# Patient Record
Sex: Male | Born: 1995 | Race: White | Hispanic: No | Marital: Single | Smoking: Current some day smoker
Health system: Southern US, Community
[De-identification: ages and names within clinical notes are randomized; demographics above are authoritative.]

---
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
## 2018-05-22 ENCOUNTER — Ambulatory Visit (HOSPITAL_COMMUNITY)
Admission: EM | Admit: 2018-05-22 | Discharge: 2018-05-22 | Disposition: A | Discharge status: Home / Self Care | Payer: Managed Care, Other (non HMO) | Attending: Family Medicine | Admitting: Family Medicine

## 2018-05-22 ENCOUNTER — Encounter (HOSPITAL_COMMUNITY): Payer: Self-pay | Admitting: Emergency Medicine

## 2018-05-22 DIAGNOSIS — R5383 Other fatigue: Secondary | ICD-10-CM

## 2018-05-22 DIAGNOSIS — R42 Dizziness and giddiness: Secondary | ICD-10-CM | POA: Diagnosis not present

## 2018-05-22 DIAGNOSIS — B349 Viral infection, unspecified: Secondary | ICD-10-CM | POA: Diagnosis not present

## 2018-05-22 DIAGNOSIS — F17200 Nicotine dependence, unspecified, uncomplicated: Secondary | ICD-10-CM | POA: Diagnosis not present

## 2018-05-22 LAB — POCT INFECTIOUS MONO SCREEN: Mono Screen: NEGATIVE

## 2018-05-22 LAB — CBC
HCT: 49.6 % (ref 39.0–52.0)
Hemoglobin: 16.7 g/dL (ref 13.0–17.0)
MCH: 28.7 pg (ref 26.0–34.0)
MCHC: 33.7 g/dL (ref 30.0–36.0)
MCV: 85.2 fL (ref 80.0–100.0)
Platelets: 197 10*3/uL (ref 150–400)
RBC: 5.82 MIL/uL — ABNORMAL HIGH (ref 4.22–5.81)
RDW: 12.4 % (ref 11.5–15.5)
WBC: 6.5 10*3/uL (ref 4.0–10.5)
nRBC: 0 % (ref 0.0–0.2)

## 2018-05-22 LAB — POCT I-STAT, CHEM 8
BUN: 9 mg/dL (ref 6–20)
CALCIUM ION: 1.22 mmol/L (ref 1.15–1.40)
CREATININE: 1 mg/dL (ref 0.61–1.24)
Chloride: 103 mmol/L (ref 98–111)
GLUCOSE: 93 mg/dL (ref 70–99)
HCT: 49 % (ref 39.0–52.0)
HEMOGLOBIN: 16.7 g/dL (ref 13.0–17.0)
Potassium: 4 mmol/L (ref 3.5–5.1)
Sodium: 142 mmol/L (ref 135–145)
TCO2: 27 mmol/L (ref 22–32)

## 2018-05-22 NOTE — ED Provider Notes (Signed)
[facility]    CSN: [id number] Arrival date & time: 05/22/18  1051     History   Chief Complaint Chief Complaint  Patient presents with  . Fatigue  . Dizziness    HPI Lawrence Hooper is a 22 y.o. male.   This is an initial visit for this 22 year old man.  Pt c/o feeling weak and lightheaded. Pt c/o fatigued. Pt states when he stood up and walked over for triage he felt like his head was fuzzy.   Initially, patient had a sore throat but this is passed.  He has not had any cough, nausea, vomiting, headache, or stiff neck.  He has been able to work all week.  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     History reviewed. No pertinent past medical history.  There are no active problems to display for this patient.   History reviewed. No pertinent surgical history.     Home Medications    Prior to Admission medications   Medication Sig Start Date End Date Taking? Authorizing Provider  guaiFENesin (MUCINEX) 600 MG 12 hr tablet Take by mouth 2 (two) times daily.   Yes [provider]    Family History Family History  Problem Relation Age of Onset  . Healthy Mother   . Healthy Father   . Cancer Maternal Grandmother     Social History Social History   Tobacco Use  . Smoking status: Current Some Day Smoker  Substance Use Topics  . Alcohol use: Yes  . Drug use: Yes    Types: Marijuana     Allergies   Patient has no known allergies.   Review of Systems Review of Systems   Physical Exam Triage Vital Signs ED Triage Vitals  Enc Vitals Group     BP 05/22/18 1148 (!) 145/95     Pulse Rate 05/22/18 1148 62     Resp 05/22/18 1148 16     Temp 05/22/18 1148 98.1 F (36.7 C)     Temp src --      SpO2 05/22/18 1148 100 %     Weight --      Height --      Head Circumference --      Peak Flow --      Pain Score 05/22/18 1149 0     Pain Loc --      Pain Edu? --      Excl. in GC? --      No data found.  Updated Vital Signs BP (!) 145/95   Pulse 62   Temp 98.1 F (36.7 C)   Resp 16   SpO2 100%    Physical Exam  Constitutional: He is oriented to person, place, and time. He appears well-developed and well-nourished.  HENT:  Head: Normocephalic.  Right Ear: External ear normal.  Left Ear: External ear normal.  Mouth/Throat: Oropharynx is clear and moist.  Normal TMs  Eyes: Pupils are equal, round, and reactive to light. Conjunctivae and EOM are normal.  Neck: Normal range of motion. Neck supple.  Cardiovascular: Normal rate, regular rhythm and normal heart sounds.  Pulmonary/Chest: Effort normal and breath sounds normal.  Abdominal: Soft. Bowel sounds are normal. He exhibits no mass.  No hepatomegaly  Musculoskeletal: Normal range of motion.  Lymphadenopathy:    He has no cervical adenopathy.  Neurological: He is alert and oriented to person, place, and time.  Skin: Skin is warm  and dry.  Psychiatric: He has a normal mood and affect. His behavior is normal. Judgment and thought content normal.  Nursing note and vitals reviewed.    UC Treatments / Results  Labs (all labs ordered are listed, but only abnormal results are displayed) Labs Reviewed  CBC  POCT I-STAT, CHEM 8  POCT INFECTIOUS MONO SCREEN    EKG None  Radiology No results found.  Procedures Procedures (including critical care time)  Medications Ordered in UC Medications - No data to display  Initial Impression / Assessment and Plan / UC Course  I have reviewed the triage vital signs and the nursing notes.  Pertinent labs & imaging results that were available during my care of the patient were reviewed by me and considered in my medical decision making (see chart for details).    Final Clinical Impressions(s) / UC Diagnoses   Final diagnoses:  Acute viral syndrome     Discharge Instructions     This appears to be an acute viral infection.  It should pass in the next  couple days.  1 test is still outstanding.  We will let you know if there is anything abnormal with this by the end of the day.  In the meantime, try to get plenty of rest and fluids.    ED Prescriptions    None     Controlled Substance Prescriptions [place] Controlled Substance Registry consulted? Not Applicable   Elvina Sidle, MD 05/22/18 1242

## 2018-05-22 NOTE — Discharge Instructions (Addendum)
This appears to be an acute viral infection.  It should pass in the next couple days.  1 test is still outstanding.  We will let you know if there is anything abnormal with this by the end of the day.  In the meantime, try to get plenty of rest and fluids.

## 2018-05-22 NOTE — ED Triage Notes (Signed)
Pt c/o feeling weak and lightheaded. Pt c/o fatigued. Pt states when he stood up and walked over for triage he felt like his head was fuzzy.

## 2019-03-26 ENCOUNTER — Other Ambulatory Visit: Payer: Self-pay | Admitting: Family Medicine

## 2019-03-26 ENCOUNTER — Other Ambulatory Visit (HOSPITAL_COMMUNITY): Payer: Self-pay | Admitting: Family Medicine

## 2019-03-26 DIAGNOSIS — N5089 Other specified disorders of the male genital organs: Secondary | ICD-10-CM

## 2019-04-02 ENCOUNTER — Other Ambulatory Visit: Payer: Self-pay

## 2019-04-02 ENCOUNTER — Ambulatory Visit (HOSPITAL_COMMUNITY)
Admission: RE | Admit: 2019-04-02 | Discharge: 2019-04-02 | Disposition: A | Discharge status: Home / Self Care | Payer: Managed Care, Other (non HMO) | Source: Ambulatory Visit | Attending: Family Medicine | Admitting: Family Medicine

## 2019-04-02 DIAGNOSIS — N433 Hydrocele, unspecified: Secondary | ICD-10-CM | POA: Insufficient documentation

## 2019-04-02 DIAGNOSIS — N503 Cyst of epididymis: Secondary | ICD-10-CM | POA: Diagnosis not present

## 2019-04-02 DIAGNOSIS — N5089 Other specified disorders of the male genital organs: Secondary | ICD-10-CM | POA: Diagnosis present

## 2021-02-27 IMAGING — US US SCROTUM W/ DOPPLER COMPLETE
1 series · 14 of 25 positions shown · non-contrast
Comparison: None.

CLINICAL DATA: Right testicular lump/mass.

EXAM:
SCROTAL ULTRASOUND
DOPPLER ULTRASOUND OF THE TESTICLES
TECHNIQUE: Complete ultrasound examination of the testicles, epididymis, and
other scrotal structures was performed. Color and spectral Doppler
ultrasound were also utilized to evaluate blood flow to the
testicles.

[Series 1: us scrotum w/ doppler complete · 14 of 81 slices shown]
[im 1/81]
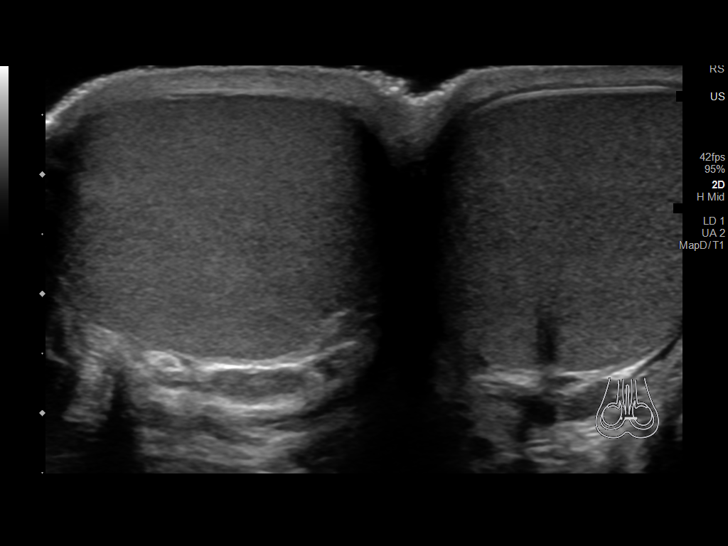
[im 7/81]
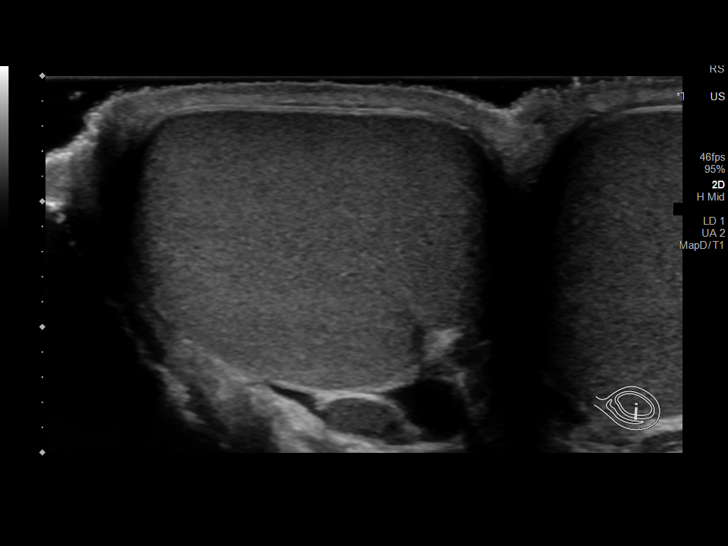
[im 14/81]
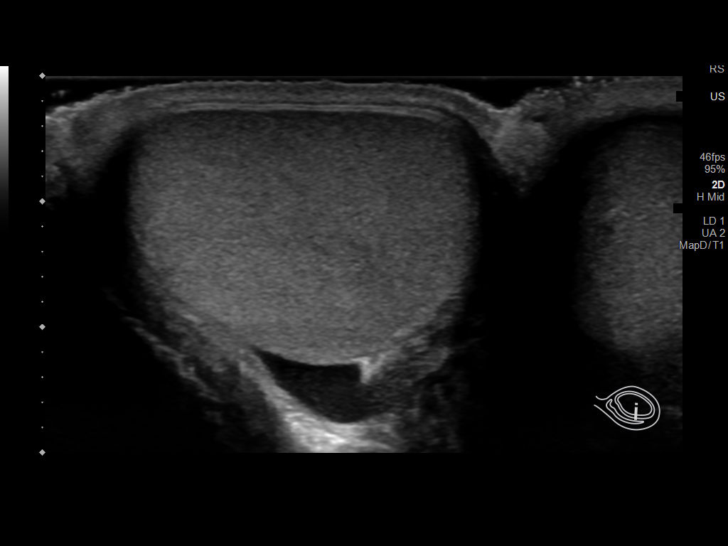
[im 21/81]
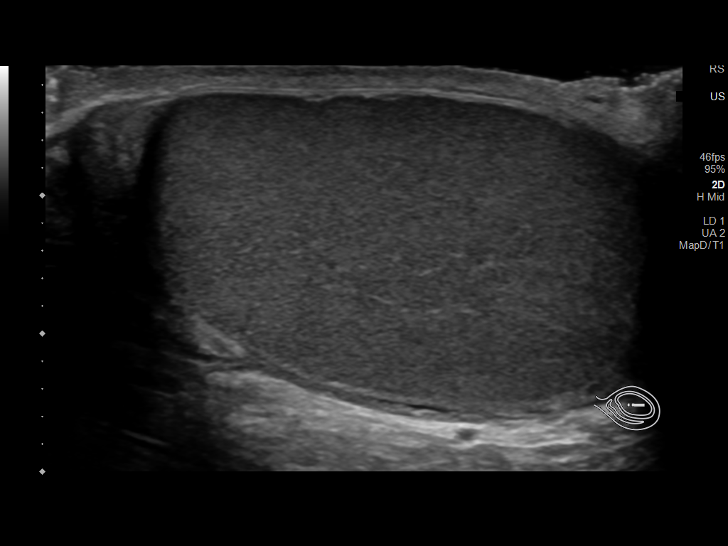
[im 27/81]
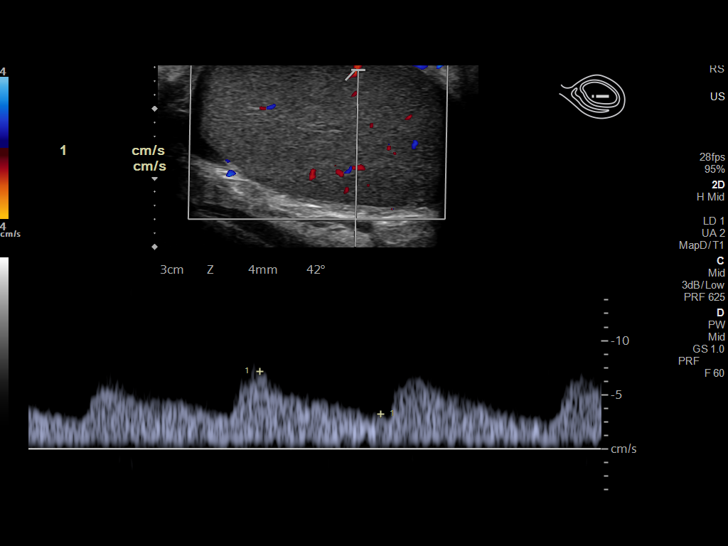
[im 31/81]
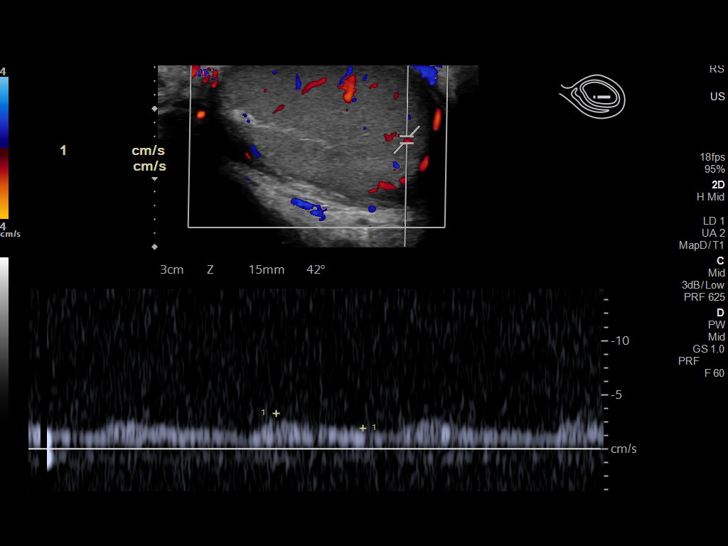
[im 37/81]
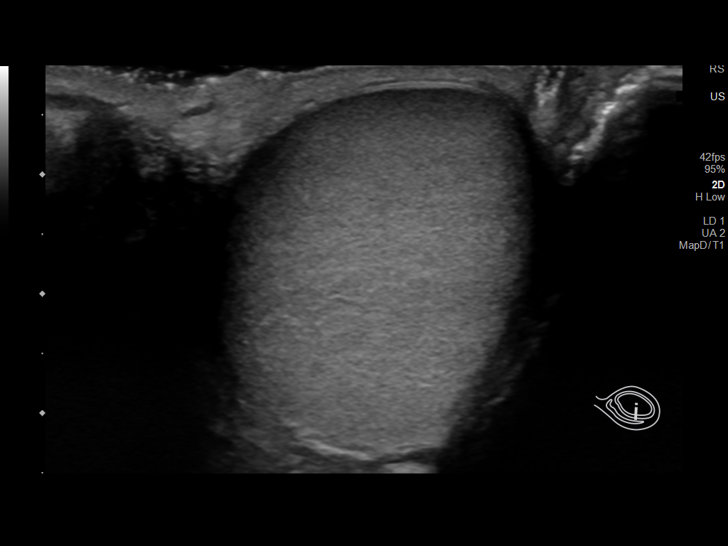
[im 44/81]
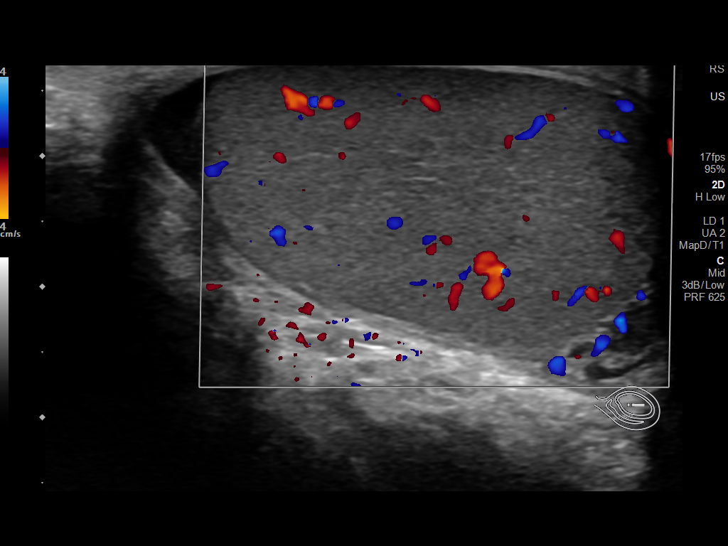
[im 51/81]
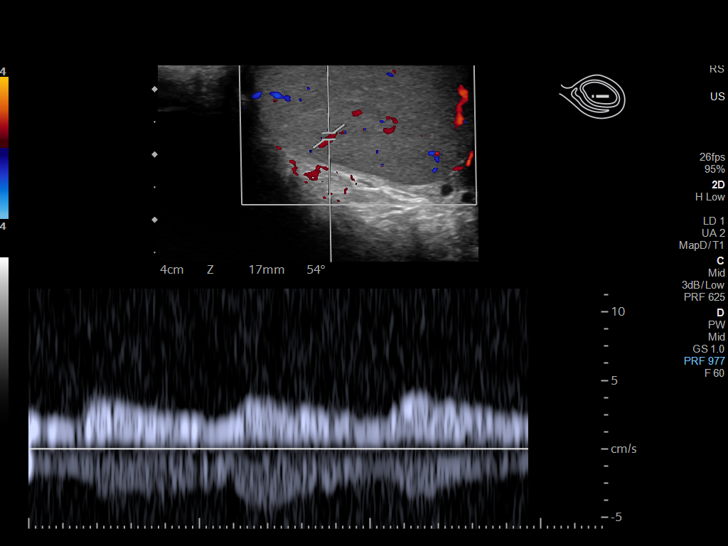
[im 54/81]
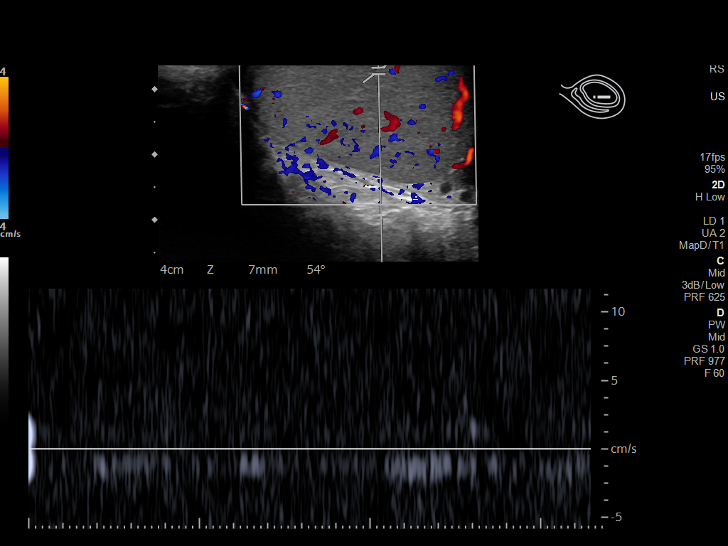
[im 61/81]
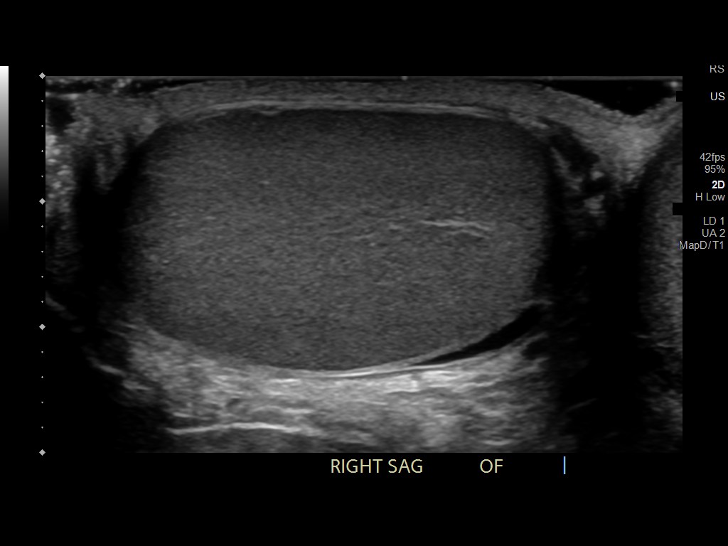
[im 67/81]
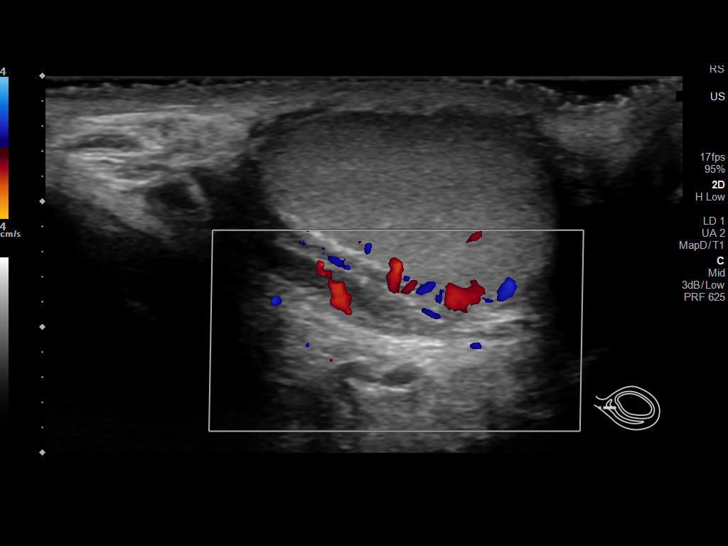
[im 74/81]
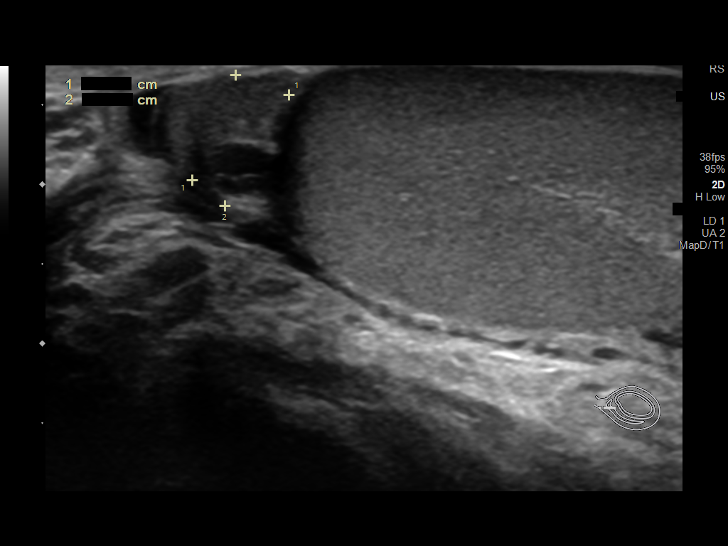
[im 81/81]
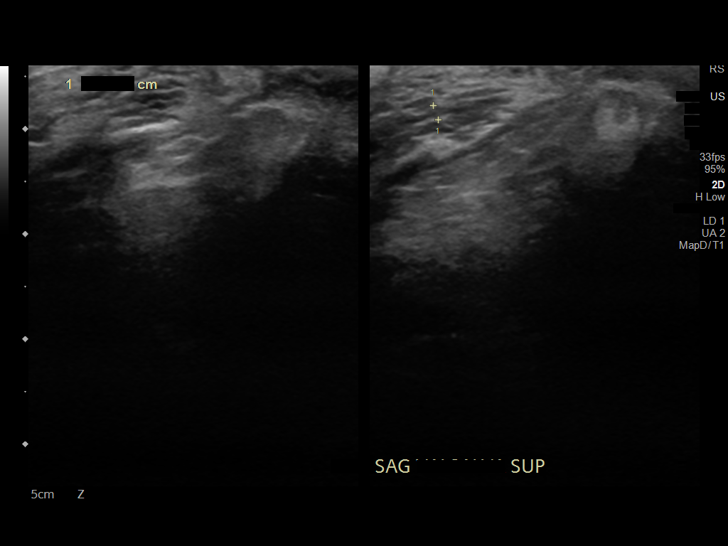

[14 of 25 positions shown; findings below may reference images not displayed]

FINDINGS: Right testicle

Measurements: 3.6 x 2.2 x 2.8 cm. No mass or microlithiasis
visualized.

Left testicle

Measurements: 3.6 x 2.2 x 2.3 cm. No mass or microlithiasis
visualized.

Right epididymis:  Normal in size and appearance.

Left epididymis:  Contains a 4 mm mildly complicated cyst.

Hydrocele:  Small bilateral hydroceles identified.

Varicocele:  None visualized.

Pulsed Doppler interrogation of both testes demonstrates normal low
resistance arterial and venous waveforms bilaterally.
IMPRESSION: 1. No cause for the patient's right-sided lump identified.
2. Nonspecific small bilateral hydroceles.
3. Small left epididymal cyst.
